# Patient Record
Sex: Male | Born: 1940 | Race: White | Hispanic: No | Marital: Married | Smoking: Former smoker
Health system: Southern US, Community
[De-identification: ages and names within clinical notes are randomized; demographics above are authoritative.]

## PROBLEM LIST (undated history)

## (undated) DIAGNOSIS — H9190 Unspecified hearing loss, unspecified ear: Secondary | ICD-10-CM

## (undated) DIAGNOSIS — M1990 Unspecified osteoarthritis, unspecified site: Secondary | ICD-10-CM

## (undated) DIAGNOSIS — E039 Hypothyroidism, unspecified: Secondary | ICD-10-CM

## (undated) DIAGNOSIS — E785 Hyperlipidemia, unspecified: Secondary | ICD-10-CM

## (undated) DIAGNOSIS — L719 Rosacea, unspecified: Secondary | ICD-10-CM

## (undated) DIAGNOSIS — I4510 Unspecified right bundle-branch block: Secondary | ICD-10-CM

## (undated) DIAGNOSIS — E119 Type 2 diabetes mellitus without complications: Secondary | ICD-10-CM

## (undated) HISTORY — PX: MULTIPLE TOOTH EXTRACTIONS: SHX2053

## (undated) HISTORY — PX: ROTATOR CUFF REPAIR: SHX139

## (undated) HISTORY — DX: Hypothyroidism, unspecified: E03.9

## (undated) HISTORY — DX: Unspecified hearing loss, unspecified ear: H91.90

## (undated) HISTORY — PX: TONSILLECTOMY AND ADENOIDECTOMY: SHX28

## (undated) HISTORY — DX: Hyperlipidemia, unspecified: E78.5

## (undated) HISTORY — DX: Rosacea, unspecified: L71.9

---

## 2000-10-28 HISTORY — PX: OTHER SURGICAL HISTORY: SHX169

## 2001-05-05 ENCOUNTER — Encounter: Payer: Self-pay | Admitting: Neurosurgery

## 2001-05-06 ENCOUNTER — Inpatient Hospital Stay (HOSPITAL_COMMUNITY): Admission: RE | Admit: 2001-05-06 | Discharge: 2001-05-08 | Payer: Self-pay | Admitting: Neurosurgery

## 2003-10-29 HISTORY — PX: COLONOSCOPY: SHX174

## 2008-10-18 ENCOUNTER — Encounter: Admission: RE | Admit: 2008-10-18 | Discharge: 2008-10-18 | Payer: Self-pay | Admitting: Family Medicine

## 2008-11-09 ENCOUNTER — Ambulatory Visit (HOSPITAL_COMMUNITY): Admission: RE | Admit: 2008-11-09 | Discharge: 2008-11-10 | Payer: Self-pay | Admitting: Specialist

## 2008-12-01 ENCOUNTER — Encounter: Admission: RE | Admit: 2008-12-01 | Discharge: 2009-03-01 | Payer: Self-pay | Admitting: Specialist

## 2009-04-20 IMAGING — CR DG CHEST 2V
2 series · 2 of 2 positions shown · non-contrast
Comparison: None

CLINICAL DATA: Preoperative evaluation for  shoulder surgery.

CHEST - 2 VIEW

[w chest pa]
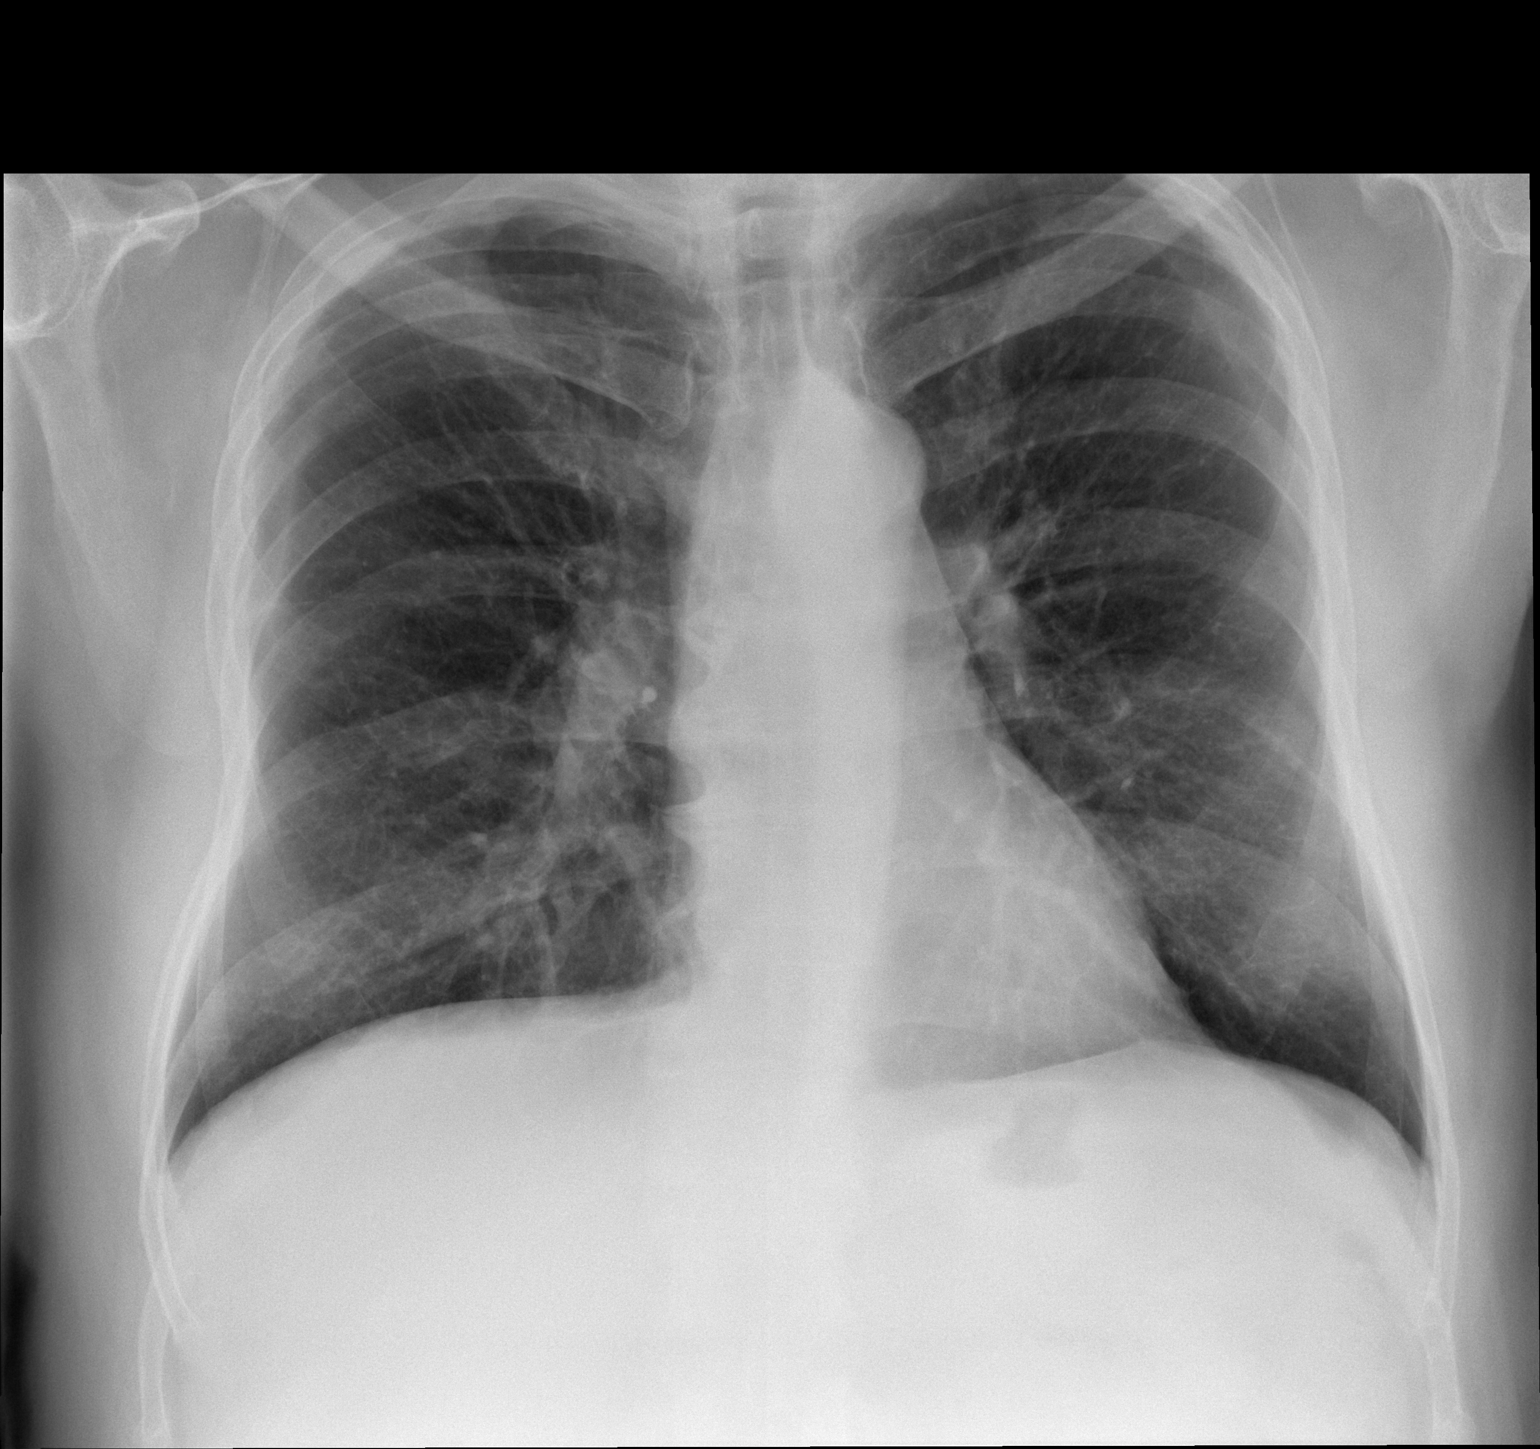

[w chest lat]
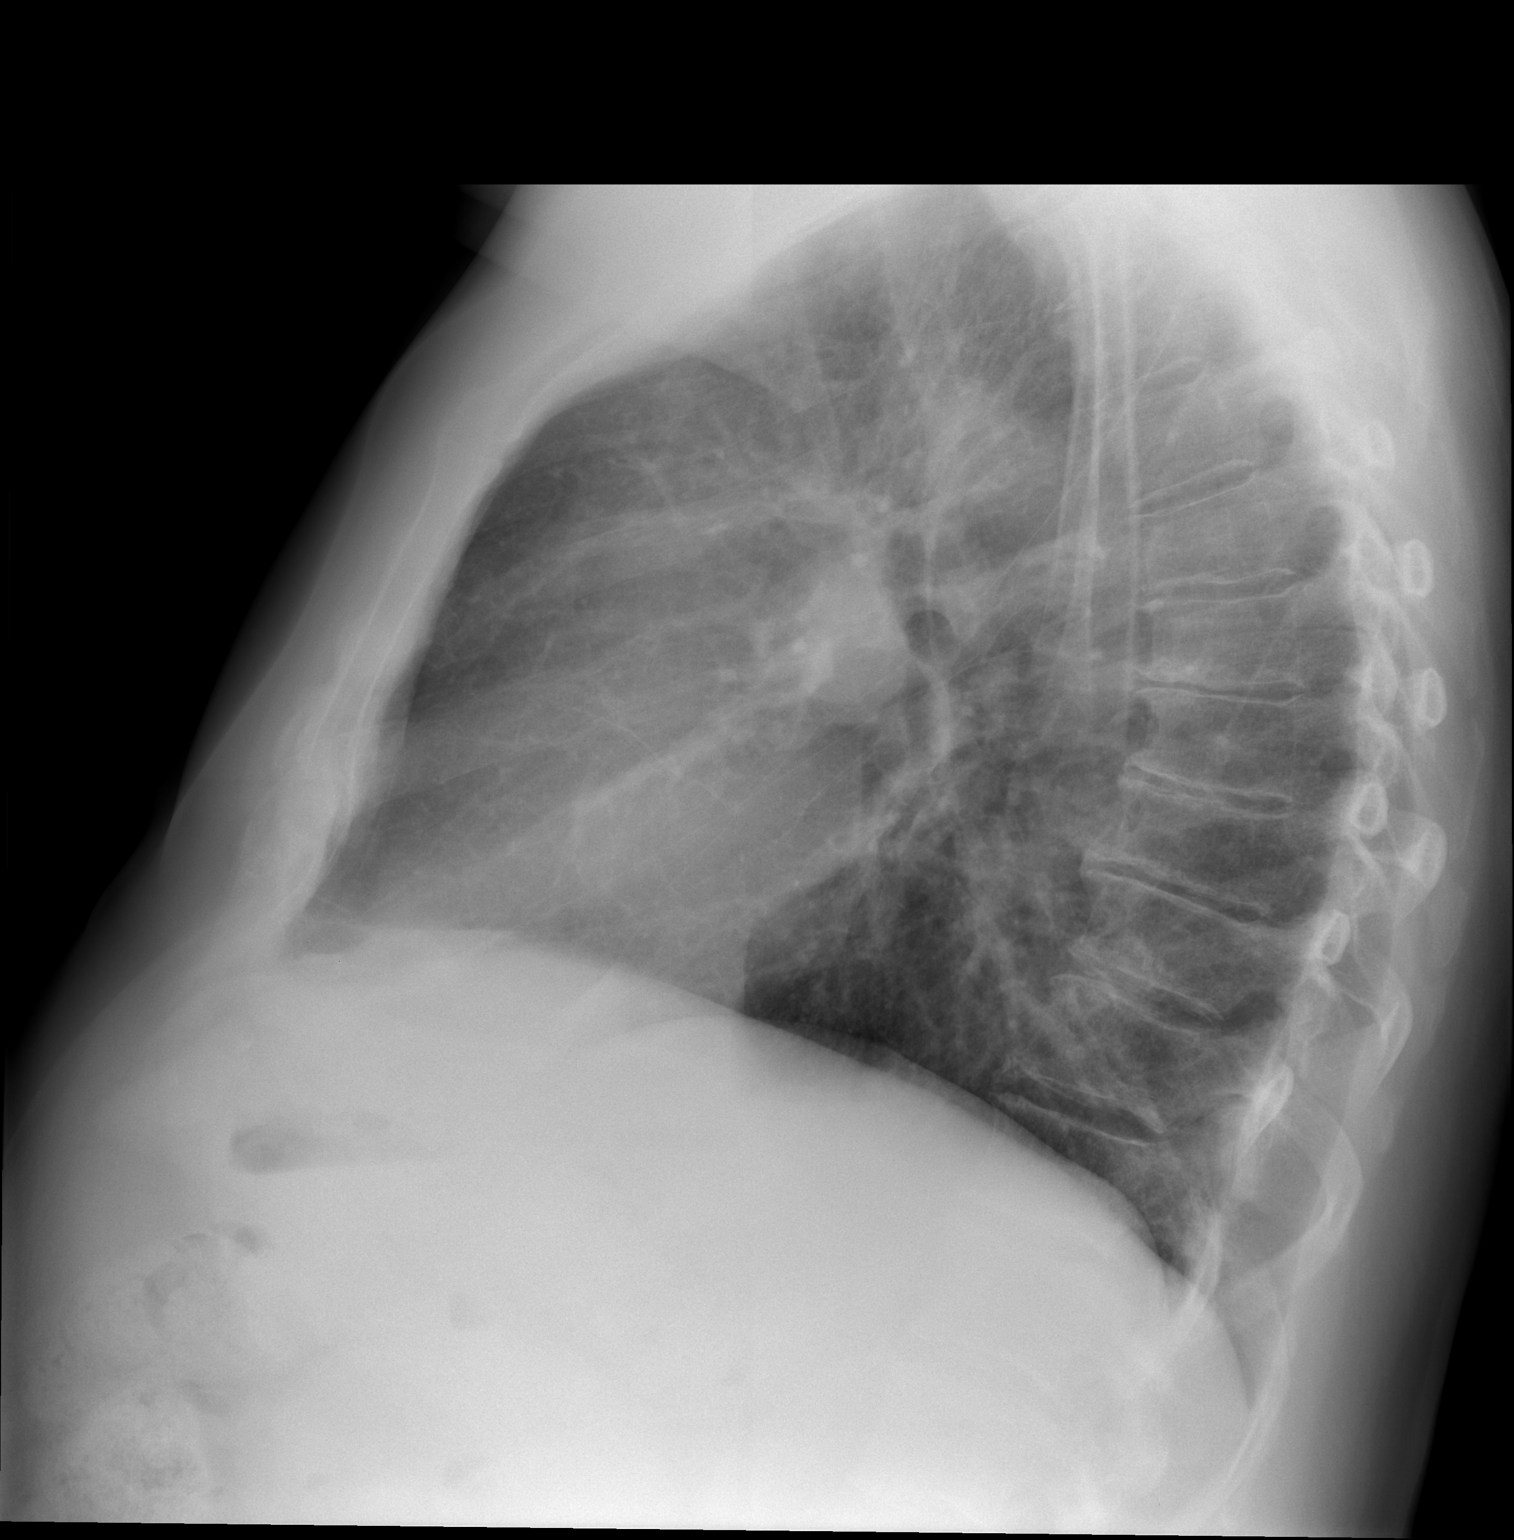

[2 of 2 positions shown; findings below may reference images not displayed]

FINDINGS: Diffuse pulmonary interstitial densities with biapical
pleural parenchymal thickening are chronic.  Negative for
infiltrates, edema or effusions.  Heart and mediastinal contours
are normal.  Diffuse thoracic spine degenerative changes.
IMPRESSION: No acute cardiopulmonary process.

## 2011-02-11 LAB — CBC
HCT: 42.2 % (ref 39.0–52.0)
Hemoglobin: 14 g/dL (ref 13.0–17.0)
MCHC: 33.2 g/dL (ref 30.0–36.0)
MCV: 82 fL (ref 78.0–100.0)
Platelets: 195 10*3/uL (ref 150–400)
RBC: 5.15 MIL/uL (ref 4.22–5.81)
RDW: 14 % (ref 11.5–15.5)
WBC: 4.1 10*3/uL (ref 4.0–10.5)

## 2011-02-11 LAB — BASIC METABOLIC PANEL
BUN: 14 mg/dL (ref 6–23)
CO2: 27 mEq/L (ref 19–32)
Calcium: 9.6 mg/dL (ref 8.4–10.5)
Chloride: 106 mEq/L (ref 96–112)
Creatinine, Ser: 0.71 mg/dL (ref 0.4–1.5)
GFR calc Af Amer: >60 mL/min (ref >60)
GFR calc non Af Amer: >60 mL/min (ref >60)
Glucose, Bld: 63 mg/dL — ABNORMAL LOW (ref 70–99)
Potassium: 4.1 mEq/L (ref 3.5–5.1)
Sodium: 140 mEq/L (ref 135–145)

## 2011-03-12 NOTE — Op Note (Signed)
Jeffrey [name], Jeffrey [name]                  ACCOUNT NO.:  [id number]   MEDICAL RECORD NO.:  [id number]          PATIENT TYPE:  AMB   LOCATION:  DAY                          FACILITY:  [facility]   PHYSICIAN:  Jene Every, M.D.    DATE OF BIRTH:  1941-05-23   DATE OF PROCEDURE:  DATE OF DISCHARGE:                               OPERATIVE REPORT   PREOPERATIVE DIAGNOSES:  1. Rotator cuff tear.  2. Acromioclavicular arthrosis right shoulder.   POSTOPERATIVE DIAGNOSIS:  Massive rotator cuff tear.   PROCEDURES PERFORMED:  1. Open subacromial decompression with acromioplasty.  2. Repair of massive rotator cuff tear with push lock suture anchoring      technique.  3. Open distal clavicle resection.   ASSISTANT:  Roma Schanz, P.A.   BRIEF HISTORY/INDICATION:  This gentleman has shoulder pain, MRI  indicating tear, and AC arthrosis, indicated for decompression rotator  cuff repair, possible distal clavicle resection if noted to be  significant impingement.  Risks, benefits discussed of bleeding,  infection, suboptimal range of motion, retear, persistent pain, etc.   TECHNIQUE:  The patient in supine beach-chair position.  After induction  of adequate general anesthesia 1-2 grams Kefzol of the right shoulder  and upper extremity was prepped and draped in the usual sterile fashion.  Good range of motion was noted under anesthesia.  Incision was made over  the anterolateral aspect of the acromion.  Subcutaneous tissue was  dissected.  Cautery to achieve hemostasis.  A raphe between the  anterolateral heads was identified and divided by 2 cm over the  anterolateral aspect of the acromion, subperiosteally elevated with  retaining the attachment of the deltoid from the anterior aspect of the  acromion.  I digitally lysed adhesions in the subacromial space and  palpated medially with significant impingement from the clavicle noted.  After excising the bursa, noting a large retracted rotator cuff,  also  tearing of the subscap. elected to proceed with a distal clavicle.  First, we extended incision over the distal clavicle incising the  deltotrapezial fascia and the capsule overlying the distal clavicle.  Then, sub skeletonized the distal clavicle with an elevator and with the  rotator cuff protected and the soft tissues protected anteriorly and  posteriorly removed a centimeter of the distal clavicle.  We then  undercut the clavicle with a 3 mm Kerrison removing spurs that were  inferiorly projecting as well as that on the acromion.  This was then  copiously irrigated.  The distal clavicle we placed bone wax on that and  repaired the capsule with O Vicryl interrupted figure-of-eight sutures.  Next, we turned back to the rotator cuff.  This was mobilized, digitally  lysed the ends, debrided the good bleeding tissue.  We felt that the  repair was too advanced from a posterolateral to anterolateral and then  repaired the subscap following that.  We debrided lateral to the  articular margin to good leading bone preparing the bed.  We used an awl  and placed two striker suture anchors in the bone, tested it with  securing of the  anchor.  We then advanced the tendon held into place  with the arm slightly abducted and threaded the four sutures through the  tendon from each suture anchor.  A surgeon's knot was then tied over the  posterior ones first and then crossed and then secured in a double row  fashion over the lateral aspect of the greater tuberosity with push lock  anchors two of them.  This was performed with the first one posteriorly.  After using an awl threading the suture and advancing and securing it  with excellent securing of the posterior portion of the supraspinatus  and infraspinatus.  We then for the one anteriorly threaded the subscap  and the supraspinatus and that threaded it and placed one of these  suture leaflets through the push lock and the anterior aspect just   lateral to the greater tuberosity with a surgeon's knot and securing the  tendon in a double fashion and finally the push lock was then placed in  the middle capturing suture ends from both the anterior and the  posterior suture anchors.  Again, advanced and placed into the push lock  with excellent securing of the tendon.  Then oversewed the subscap and  the rotator cuff interval with #1 Vicryl interrupted figure-of-eight  sutures.  Good watertight closure was noted.  Good range of motion  without tension on the site.  Wound copiously irrigated.  Redundant  suture removed.  After excising the bursa initially we repaired the  raphe with #1 Vicryl interrupted figure-of-eight sutures over the top  and through the acromion, subcu with 2-0 Vicryl simple suture after  copious irrigation of the subacromial space and then of the tissue  layers.  The deltotrapezial fascia was repaired with 2-0 Vicryl simple  sutures and skin was reapproximated with staples.  Would was dressed  sterilely.  Placed in abduction pillow and sling, extubated without  difficulty and transported to the recovery room in satisfactory  condition.   The patient tolerated the procedure well.  There were no complications.      Jene Every, M.D.  Electronically Signed     JB/MEDQ  D:  11/09/2008  T:  11/09/2008  Job:  [id number]

## 2011-03-15 NOTE — Op Note (Signed)
. [facility]  Patient:    EIN, Jeffrey [name]                         MRN: [id number] Proc. Date: 05/06/01 Adm. Date:  [id number] Attending:  Donn Pierini                           Operative Report  PREOPERATIVE DIAGNOSIS:  Right V2 trigeminal neuralgia.  POSTOPERATIVE DIAGNOSIS:  Right V2 trigeminal neuralgia.  PROCEDURE:  Right suboccipital craniectomy with right fifth nerve microvascular decompression.  SURGEON:  Julio Sicks, M.D.  ASSISTANT:  Donalee Citrin, Montez Hageman., M.D.  ANESTHESIA:  General endotracheal.  INDICATIONS:  [name] is a 70 year old male with history of intractable right-sided V2 distribution trigeminal neuralgia.  The patient has been counseled as to his options.  He wishes to proceed with a right-sided microvascular decompression in hopes of alleviating some of his symptoms.  DESCRIPTION OF PROCEDURE:  Patient taken to the operating room and placed on the operating table in supine position.  After an adequate level of anesthesia achieved, the patient was positioned in the left lateral decubitus position with his head fixed in a Sugita pin head rest.  The patients suboccipital region was shaved and prepped sterilely.  A 10 blade was used to make a linear skin incision extending from a point overlying the transverse sinus down to the midpoint between the mastoid process and the suboccipital bone.  This was carried down sharply to the cranium.  A suboccipital craniectomy was then fashioned using the high-speed drill and Kerrison rongeurs.  The craniectomy extended to the transverse sinus superiorly and to the sigmoid sinus laterally.  Dura was then opened in a cruciate fashion.  It was held in place with traction sutures.  CSF was allowed to drain from the cerebellar pontine angle.  The microscope was brought into the field and used for microdissection of the CP angle.  Arachnoid was dissected free from the lateral aspect of  the cerebellum.  A petrosal vein was identified and coagulated using bipolar electrocautery.  This was then divided.  The fifth nerve complex was identified, as was the brainstem.  A Sugita self-retaining retractor was used to hold the cerebellum in place.  There was a superior/ventral vascular loop, which was indenting the fifth nerve complex at the dorsal root entry zone. This was dissected free using microdissection.  The vascular loop was mobilized rostrally along the brainstem.  It was held in place with Teflon felt.  This completely decompressed the fifth nerve complex.  The fifth nerve was further inspected.  There was no evidence of any other vascular loops or other compressive lesions.  The wound was irrigated with antibiotic solution. The dura was loosely reapproximated.  Gelfoam and Tisseel were placed over the dural opening.  Edges of the craniectomy defect were waxed, with particular attention paid to the mastoid air cells.  _____ bone graft substitute was then placed over the craniectomy defect.  The wound was then irrigated one final time.  It was then closed in layers with Vicryl sutures.  A running vertical mattress interlocking suture was then placed over the skin.  A sterile dressing was applied.  There were no apparent complications.  The patient tolerated the procedure well, and he returns to the recovery room postoperatively. DD:  05/06/01 TD:  05/07/01 Job: [id number]

## 2013-10-19 ENCOUNTER — Encounter: Payer: Self-pay | Admitting: Gastroenterology

## 2014-03-29 ENCOUNTER — Encounter: Payer: Self-pay | Admitting: Gastroenterology

## 2014-06-11 ENCOUNTER — Encounter: Payer: Self-pay | Admitting: *Deleted

## 2014-06-17 ENCOUNTER — Encounter: Payer: Self-pay | Admitting: Internal Medicine

## 2014-08-02 ENCOUNTER — Ambulatory Visit (AMBULATORY_SURGERY_CENTER): Payer: Commercial Managed Care - HMO

## 2014-08-02 VITALS — Ht 70.5 in | Wt 235.2 lb

## 2014-08-02 DIAGNOSIS — Z86010 Personal history of colonic polyps: Secondary | ICD-10-CM

## 2014-08-02 MED ORDER — MOVIPREP 100 G PO SOLR: ORAL | Status: DC

## 2014-08-02 NOTE — Progress Notes (Signed)
Per pt, no allergies to soy or egg products.Pt not taking any weight loss meds or using  O2 at home. 

## 2014-08-17 ENCOUNTER — Encounter: Payer: Self-pay | Admitting: Internal Medicine

## 2014-08-17 ENCOUNTER — Ambulatory Visit (AMBULATORY_SURGERY_CENTER): Payer: Commercial Managed Care - HMO | Admitting: Internal Medicine

## 2014-08-17 VITALS — BP 123/72 | HR 74 | Temp 96.6°F | Resp 19 | Ht 70.0 in | Wt 235.0 lb

## 2014-08-17 DIAGNOSIS — D125 Benign neoplasm of sigmoid colon: Secondary | ICD-10-CM

## 2014-08-17 DIAGNOSIS — K635 Polyp of colon: Secondary | ICD-10-CM

## 2014-08-17 DIAGNOSIS — Z1211 Encounter for screening for malignant neoplasm of colon: Secondary | ICD-10-CM

## 2014-08-17 DIAGNOSIS — Z1212 Encounter for screening for malignant neoplasm of rectum: Principal | ICD-10-CM

## 2014-08-17 DIAGNOSIS — D122 Benign neoplasm of ascending colon: Secondary | ICD-10-CM

## 2014-08-17 MED ORDER — SODIUM CHLORIDE 0.9 % IV SOLN: 500.0000 mL | INTRAVENOUS | Status: DC

## 2014-08-17 NOTE — Patient Instructions (Signed)
YOU HAD AN ENDOSCOPIC PROCEDURE TODAY AT THE [facility]: Refer to the procedure report that was given to you for any specific questions about what was found during the examination.  If the procedure report does not answer your questions, please call your gastroenterologist to clarify.  If you requested that your care partner not be given the details of your procedure findings, then the procedure report has been included in a sealed envelope for you to review at your convenience later.  YOU SHOULD EXPECT: Some feelings of bloating in the abdomen. Passage of more gas than usual.  Walking can help get rid of the air that was put into your GI tract during the procedure and reduce the bloating. If you had a lower endoscopy (such as a colonoscopy or flexible sigmoidoscopy) you may notice spotting of blood in your stool or on the toilet paper. If you underwent a bowel prep for your procedure, then you may not have a normal bowel movement for a few days.  DIET: Your first meal following the procedure should be a light meal and then it is ok to progress to your normal diet.  A half-sandwich or bowl of soup is an example of a good first meal.  Heavy or fried foods are harder to digest and may make you feel nauseous or bloated.  Likewise meals heavy in dairy and vegetables can cause extra gas to form and this can also increase the bloating.  Drink plenty of fluids but you should avoid alcoholic beverages for 24 hours.  ACTIVITY: Your care partner should take you home directly after the procedure.  You should plan to take it easy, moving slowly for the rest of the day.  You can resume normal activity the day after the procedure however you should NOT DRIVE or use heavy machinery for 24 hours (because of the sedation medicines used during the test).    SYMPTOMS TO REPORT IMMEDIATELY: A gastroenterologist can be reached at any hour.  During normal business hours, 8:30 AM to 5:00 PM Monday through Friday,  call [phone].  After hours and on weekends, please call the GI answering service at [phone] who will take a message and have the physician on call contact you.   Following lower endoscopy (colonoscopy or flexible sigmoidoscopy):  Excessive amounts of blood in the stool  Significant tenderness or worsening of abdominal pains  Swelling of the abdomen that is new, acute  Fever of 100F or higher    FOLLOW UP: If any biopsies were taken you will be contacted by phone or by letter within the next 1-3 weeks.  Call your gastroenterologist if you have not heard about the biopsies in 3 weeks.  Our staff will call the home number listed on your records the next business day following your procedure to check on you and address any questions or concerns that you may have at that time regarding the information given to you following your procedure. This is a courtesy call and so if there is no answer at the home number and we have not heard from you through the emergency physician on call, we will assume that you have returned to your regular daily activities without incident.  SIGNATURES/CONFIDENTIALITY: You and/or your care partner have signed paperwork which will be entered into your electronic medical record.  These signatures attest to the fact that that the information above on your After Visit Summary has been reviewed and is understood.  Full responsibility of the confidentiality   of this discharge information lies with you and/or your care-partner.  Polyps, diverticulosis, and high fiber diet information given.

## 2014-08-17 NOTE — Op Note (Signed)
[place]  [address] [place], [id number]   COLONOSCOPY PROCEDURE REPORT  PATIENT: Jeffrey, [name]  MR#: [id number] BIRTHDATE: [birth year] , 58  yrs. old GENDER: male ENDOSCOPIST: Jerene Bears, MD REFERRED PO:EUMPNTIRW Shaw, M.D. PROCEDURE DATE:  08/17/2014 PROCEDURE:   Colonoscopy with snare polypectomy and Colonoscopy with biopsy First Screening Colonoscopy - Avg.  risk and is 50 yrs.  old or older - No.  Prior Negative Screening - Now for repeat screening. 10 or more years since last screening  History of Adenoma - Now for follow-up colonoscopy & has been > or = to 3 yrs.  N/A  Polyps Removed Today? Yes. ASA CLASS:   Class II INDICATIONS:average risk for colorectal cancer and last colonoscopy completed 2005 MEDICATIONS: Monitored anesthesia care and Propofol 200 mg IV; lidocaine 40 mg IV  DESCRIPTION OF PROCEDURE:   After the risks benefits and alternatives of the procedure were thoroughly explained, informed consent was obtained.  The digital rectal exam revealed external hemorrhoids and no rectal mass.   The LB ER-XV400 [id number] endoscope was introduced through the anus and advanced to the cecum, which was identified by both the appendix and ileocecal valve. No adverse events experienced.   The quality of the prep was good, using MoviPrep  The instrument was then slowly withdrawn as the colon was fully examined.      COLON FINDINGS: Four sessile polyps ranging from 3 to 83mm in size were found in the ascending colon (3) and sigmoid colon (1). Polypectomies were performed with a cold snare (3 ascending and 1 sigmoid) and with cold forceps (1 ascending).  The resection was complete, the polyp tissue was completely retrieved and sent to histology.   There was mild diverticulosis noted in the transverse colon becoming severe in the descending colon, and sigmoid colon with associated muscular hypertrophy.  Retroflexed views revealed external hemorrhoids.  The time to cecum=3 minutes 18 seconds. Withdrawal time=13 minutes 44 seconds.  The scope was withdrawn and the procedure completed.  COMPLICATIONS: There were no immediate complications.  ENDOSCOPIC IMPRESSION: 1.   Four sessile polyps ranging from 3 to 40mm in size were found in the ascending colon and sigmoid colon; polypectomies were performed with a cold snare and with cold forceps 2.   There was mild diverticulosis noted in the transverse colon becoming severe in the descending colon, and sigmoid colon  RECOMMENDATIONS: 1.  Await pathology results 2.  High fiber diet 3.  Timing of repeat colonoscopy will be determined by pathology findings. 4.  You will receive a letter within 1-2 weeks with the results of your biopsy as well as final recommendations.  Please call my office if you have not received a letter after 3 weeks.  eSigned:  Jerene Bears, MD 08/17/2014 10:45 AM   cc: Mayra Neer, MD and The Patient   PATIENT NAME:  Jeffrey, [name] MR#: [id number]

## 2014-08-17 NOTE — Progress Notes (Signed)
Stable to RR 

## 2014-08-17 NOTE — Progress Notes (Signed)
Called to room to assist during endoscopic procedure.  Patient ID and intended procedure confirmed with present staff. Received instructions for my participation in the procedure from the performing physician.  

## 2014-08-18 ENCOUNTER — Telehealth: Payer: Self-pay

## 2014-08-18 NOTE — Telephone Encounter (Signed)
  Follow up Call-  Call back number 08/17/2014  Post procedure Call Back phone  # [phone]  Permission to leave phone message Yes     Patient questions:  Do you have a fever, pain , or abdominal swelling? No. Pain Score  0 *  Have you tolerated food without any problems? Yes.    Have you been able to return to your normal activities? Yes.    Do you have any questions about your discharge instructions: Diet   No. Medications  No. Follow up visit  No.  Do you have questions or concerns about your Care? No.  Actions: * If pain score is 4 or above: No action needed, pain <4.  No problems per the pt. maw

## 2014-08-23 ENCOUNTER — Encounter: Payer: Self-pay | Admitting: Internal Medicine

## 2019-05-19 ENCOUNTER — Encounter: Payer: Self-pay | Admitting: *Deleted

## 2019-08-03 ENCOUNTER — Encounter: Payer: Self-pay | Admitting: Internal Medicine

## 2019-08-10 ENCOUNTER — Encounter: Payer: Self-pay | Admitting: Internal Medicine

## 2019-09-06 ENCOUNTER — Other Ambulatory Visit: Payer: Self-pay

## 2019-09-06 ENCOUNTER — Ambulatory Visit (AMBULATORY_SURGERY_CENTER): Payer: Medicare HMO | Admitting: *Deleted

## 2019-09-06 ENCOUNTER — Encounter: Payer: Self-pay | Admitting: Internal Medicine

## 2019-09-06 VITALS — Ht 69.0 in | Wt 225.8 lb

## 2019-09-06 DIAGNOSIS — Z86010 Personal history of colonic polyps: Secondary | ICD-10-CM

## 2019-09-06 DIAGNOSIS — Z1159 Encounter for screening for other viral diseases: Secondary | ICD-10-CM

## 2019-09-06 MED ORDER — SUPREP BOWEL PREP KIT 17.5-3.13-1.6 GM/177ML PO SOLN: 1.0000 | Freq: Once | ORAL | 0 refills | Status: AC

## 2019-09-06 NOTE — Progress Notes (Signed)
Patient denies any allergies to egg or soy products. Patient denies complications with anesthesia/sedation.  Patient denies oxygen use at home and denies diet medications. Emmi instructions for colonoscopy/endoscopy explained and given to patient.  Patient instructed to take allowed medications by 5:30 am on 09/20/19.

## 2019-09-15 ENCOUNTER — Ambulatory Visit (INDEPENDENT_AMBULATORY_CARE_PROVIDER_SITE_OTHER): Payer: Medicare HMO

## 2019-09-15 ENCOUNTER — Other Ambulatory Visit: Payer: Self-pay | Admitting: Internal Medicine

## 2019-09-15 DIAGNOSIS — Z1159 Encounter for screening for other viral diseases: Secondary | ICD-10-CM

## 2019-09-16 LAB — SARS CORONAVIRUS 2 (TAT 6-24 HRS): SARS Coronavirus 2: NEGATIVE

## 2019-09-20 ENCOUNTER — Ambulatory Visit (AMBULATORY_SURGERY_CENTER): Payer: Medicare HMO | Admitting: Internal Medicine

## 2019-09-20 ENCOUNTER — Encounter: Payer: Self-pay | Admitting: Internal Medicine

## 2019-09-20 ENCOUNTER — Other Ambulatory Visit: Payer: Self-pay

## 2019-09-20 VITALS — BP 116/73 | HR 72 | Temp 98.5°F | Resp 16 | Ht 69.0 in | Wt 225.0 lb

## 2019-09-20 DIAGNOSIS — D124 Benign neoplasm of descending colon: Secondary | ICD-10-CM

## 2019-09-20 DIAGNOSIS — Z86010 Personal history of colonic polyps: Secondary | ICD-10-CM | POA: Diagnosis not present

## 2019-09-20 DIAGNOSIS — K635 Polyp of colon: Secondary | ICD-10-CM

## 2019-09-20 MED ORDER — SODIUM CHLORIDE 0.9 % IV SOLN: 500.0000 mL | Freq: Once | INTRAVENOUS | Status: DC

## 2019-09-20 NOTE — Op Note (Signed)
[facility] Patient Name: Jeffrey [name] Procedure Date: 09/20/2019 8:46 AM MRN: [id number] Endoscopist: Jerene Bears , MD Age: 78 Referring MD:  Date of Birth: [birth year] Gender: Male Account #: [id number] Procedure:                Colonoscopy Indications:              Surveillance: Personal history of adenomatous                            polyps on last colonoscopy 5 years ago Medicines:                Monitored Anesthesia Care Procedure:                Pre-Anesthesia Assessment:                           - Prior to the procedure, a History and Physical                            was performed, and patient medications and                            allergies were reviewed. The patient's tolerance of                            previous anesthesia was also reviewed. The risks                            and benefits of the procedure and the sedation                            options and risks were discussed with the patient.                            All questions were answered, and informed consent                            was obtained. Prior Anticoagulants: The patient has                            taken no previous anticoagulant or antiplatelet                            agents. ASA Grade Assessment: II - A patient with                            mild systemic disease. After reviewing the risks                            and benefits, the patient was deemed in                            satisfactory condition to undergo the procedure.  After obtaining informed consent, the colonoscope                            was passed under direct vision. Throughout the                            procedure, the patient's blood pressure, pulse, and                            oxygen saturations were monitored continuously. The                            Colonoscope was introduced through the anus and                            advanced to the cecum,  identified by appendiceal                            orifice and ileocecal valve. The colonoscopy was                            performed without difficulty. The patient tolerated                            the procedure well. The quality of the bowel                            preparation was good. The ileocecal valve,                            appendiceal orifice, and rectum were photographed. Scope In: 8:48:08 AM Scope Out: 9:03:16 AM Scope Withdrawal Time: 0 hours 12 minutes 3 seconds  Total Procedure Duration: 0 hours 15 minutes 8 seconds  Findings:                 The digital rectal exam was normal.                           Two sessile polyps were found in the descending                            colon. The polyps were 3 to 4 mm in size. These                            polyps were removed with a cold snare. Resection                            and retrieval were complete.                           Multiple small and large-mouthed diverticula were                            found in the sigmoid colon, descending colon and  ascending colon. Moderate to severe left sided                            diverticulosis.                           Internal hemorrhoids were found during                            retroflexion. The hemorrhoids were small. Complications:            No immediate complications. Estimated Blood Loss:     Estimated blood loss was minimal. Impression:               - Two 3 to 4 mm polyps in the descending colon,                            removed with a cold snare. Resected and retrieved.                           - Diverticulosis in the sigmoid colon, in the                            descending colon and in the ascending colon.                           - Small internal hemorrhoids. Recommendation:           - Patient has a contact number available for                            emergencies. The signs and symptoms of potential                             delayed complications were discussed with the                            patient. Return to normal activities tomorrow.                            Written discharge instructions were provided to the                            patient.                           - Resume previous diet.                           - Continue present medications.                           - Await pathology results.                           - You will likely not need a repeat colonoscopy for  surveillance due to age as this test generally                            stops around age 51. If you develop concerning                            symptoms such as blood in stools, abdominal pain or                            change in bowel habits please contact my office. Jerene Bears, MD 09/20/2019 9:07:12 AM This report has been signed electronically.

## 2019-09-20 NOTE — Progress Notes (Signed)
Called to room to assist during endoscopic procedure.  Patient ID and intended procedure confirmed with present staff. Received instructions for my participation in the procedure from the performing physician.  

## 2019-09-20 NOTE — Patient Instructions (Signed)
Thank you for for allowing [place] to care for you today!  Resume previous diet and medications today.  Return to your normal activities tomorrow.  Await pathology results by mail, approximately 7-10 days.  Will likely not need a repeat colonoscopy due to current age guidelines.   Don't hesitate to call if you develop and changes in bowel habits or other unusual symptoms.      Thank YOU HAD AN ENDOSCOPIC PROCEDURE TODAY AT [facility]:   Refer to the procedure report that was given to you for any specific questions about what was found during the examination.  If the procedure report does not answer your questions, please call your gastroenterologist to clarify.  If you requested that your care partner not be given the details of your procedure findings, then the procedure report has been included in a sealed envelope for you to review at your convenience later.  YOU SHOULD EXPECT: Some feelings of bloating in the abdomen. Passage of more gas than usual.  Walking can help get rid of the air that was put into your GI tract during the procedure and reduce the bloating. If you had a lower endoscopy (such as a colonoscopy or flexible sigmoidoscopy) you may notice spotting of blood in your stool or on the toilet paper. If you underwent a bowel prep for your procedure, you may not have a normal bowel movement for a few days.  Please Note:  You might notice some irritation and congestion in your nose or some drainage.  This is from the oxygen used during your procedure.  There is no need for concern and it should clear up in a day or so.  SYMPTOMS TO REPORT IMMEDIATELY:   Following lower endoscopy (colonoscopy or flexible sigmoidoscopy):  Excessive amounts of blood in the stool  Significant tenderness or worsening of abdominal pains  Swelling of the abdomen that is new, acute  Fever of 100F or higher    For urgent or emergent issues, a gastroenterologist can be reached at any hour by  calling [phone].   DIET:  We do recommend a small meal at first, but then you may proceed to your regular diet.  Drink plenty of fluids but you should avoid alcoholic beverages for 24 hours.  ACTIVITY:  You should plan to take it easy for the rest of today and you should NOT DRIVE or use heavy machinery until tomorrow (because of the sedation medicines used during the test).    FOLLOW UP: Our staff will call the number listed on your records 48-72 hours following your procedure to check on you and address any questions or concerns that you may have regarding the information given to you following your procedure. If we do not reach you, we will leave a message.  We will attempt to reach you two times.  During this call, we will ask if you have developed any symptoms of COVID 19. If you develop any symptoms (ie: fever, flu-like symptoms, shortness of breath, cough etc.) before then, please call [phone].  If you test positive for Covid 19 in the 2 weeks post procedure, please call and report this information to [place].    If any biopsies were taken you will be contacted by phone or by letter within the next 1-3 weeks.  Please call us at [phone] if you have not heard about the biopsies in 3 weeks.    SIGNATURES/CONFIDENTIALITY: You and/or your care partner have signed paperwork which will be  entered into your electronic medical record.  These signatures attest to the fact that that the information above on your After Visit Summary has been reviewed and is understood.  Full responsibility of the confidentiality of this discharge information lies with you and/or your care-partner. 

## 2019-09-20 NOTE — Progress Notes (Signed)
Temp check by:YF Vital check by:CW  The patient states no changes in medical or surgical history since pre-visit screening on 09/06/2019.

## 2019-09-20 NOTE — Progress Notes (Signed)
Report given to PACU, vss 

## 2019-09-22 ENCOUNTER — Telehealth: Payer: Self-pay | Admitting: *Deleted

## 2019-09-22 NOTE — Telephone Encounter (Signed)
  Follow up Call-  Call back number 09/20/2019  Post procedure Call Back phone  # [phone]  Permission to leave phone message Yes  Some recent data might be hidden     Patient questions:  Do you have a fever, pain , or abdominal swelling? No. Pain Score  0 *  Have you tolerated food without any problems? Yes.    Have you been able to return to your normal activities? Yes.    Do you have any questions about your discharge instructions: Diet   No. Medications  No. Follow up visit  No.  Do you have questions or concerns about your Care? No.  Actions: * If pain score is 4 or above: No action needed, pain <4.  1. Have you developed a fever since your procedure? no  2.   Have you had an respiratory symptoms (SOB or cough) since your procedure? no  3.   Have you tested positive for COVID 19 since your procedure no  4.   Have you had any family members/close contacts diagnosed with the COVID 19 since your procedure?  no   If yes to any of these questions please route to Joylene John, RN and Alphonsa Gin, Therapist, sports.

## 2019-09-27 ENCOUNTER — Encounter: Payer: Self-pay | Admitting: Internal Medicine

## 2024-05-26 ENCOUNTER — Encounter (HOSPITAL_COMMUNITY): Payer: Self-pay

## 2024-05-26 NOTE — Patient Instructions (Addendum)
 SURGICAL WAITING ROOM VISITATION  Patients having surgery or a procedure may have no more than 2 support people in the waiting area - these visitors may rotate.    Children under the age of 67 must have an adult with them who is not the patient.  Visitors with respiratory illnesses are discouraged from visiting and should remain at home.  If the patient needs to stay at the hospital during part of their recovery, the visitor guidelines for inpatient rooms apply. Pre-op nurse will coordinate an appropriate time for 1 support person to accompany patient in pre-op.  This support person may not rotate.    Please refer to the [facility] website for the visitor guidelines for Inpatients (after your surgery is over and you are in a regular room).       Your procedure is scheduled on: 06-08-24   Report to [facility] Main Entrance    Report to admitting at     0610  AM   Call this number if you have problems the morning of surgery [phone]   Do not eat food :After Midnight.   After Midnight you may have the following liquids until __0540____ AM/  DAY OF SURGERY then nothing by mouth  Water Non-Citrus Juices (without pulp, NO RED-Apple, White grape, White cranberry) Black Coffee (NO MILK/CREAM OR CREAMERS, sugar ok)  Clear Tea (NO MILK/CREAM OR CREAMERS, sugar ok) regular and decaf                             Plain Jell-O (NO RED)                                           Fruit ices (not with fruit pulp, NO RED)                                     Popsicles (NO RED)                                                               Sports drinks like Gatorade (NO RED)                    The day of surgery:  Drink ONE (1) Pre-Surgery  G2 BY    0540   AM the morning of surgery. Drink in one sitting. Do not sip.  This drink was given to you during your hospital  pre-op appointment visit. Nothing else to drink after completing the  Pre-Surgery G2.          If you have  questions, please contact your surgeon's office.   FOLLOW ANY ADDITIONAL PRE OP INSTRUCTIONS YOU RECEIVED FROM YOUR SURGEON'S OFFICE!!!     Oral Hygiene is also important to reduce your risk of infection.                                    Remember - BRUSH YOUR TEETH THE MORNING OF SURGERY WITH YOUR  REGULAR TOOTHPASTE  DENTURES WILL BE REMOVED PRIOR TO SURGERY PLEASE DO NOT APPLY Poly grip OR ADHESIVES!!!   Do NOT smoke after Midnight   Stop all vitamins and herbal supplements 7 days before surgery.   Take these medicines the morning of surgery with A SIP OF WATER: oxycodone if needed, levothyroxine, atorvastatin  DO NOT TAKE ANY ORAL DIABETIC MEDICATIONS DAY OF YOUR SURGERY  Bring CPAP mask and tubing day of surgery.                              You may not have any metal on your body including hair pins, jewelry, and body piercing             Do not wear lotions, powders, cologne, or deodorant               Men may shave face and neck.   Do not bring valuables to the hospital. [facility] IS NOT             RESPONSIBLE   FOR VALUABLES.   Contacts, glasses, dentures or bridgework may not be worn into surgery.   Bring small overnight bag day of surgery.   DO NOT BRING YOUR HOME MEDICATIONS TO THE HOSPITAL. PHARMACY WILL DISPENSE MEDICATIONS LISTED ON YOUR MEDICATION LIST TO YOU DURING YOUR ADMISSION IN THE HOSPITAL!    Patients discharged on the day of surgery will not be allowed to drive home.  Someone NEEDS to stay with you for the first 24 hours after anesthesia.   Special Instructions: Bring a copy of your healthcare power of attorney and living will documents the day of surgery if you haven't scanned them before.              Please read over the following fact sheets you were given: IF YOU HAVE QUESTIONS ABOUT YOUR PRE-OP INSTRUCTIONS PLEASE CALL [phone].    If you test positive for Covid or have been in contact with anyone that has tested positive in the last 10  days please notify you surgeon.      Pre-operative 5 CHG Bath Instructions   You can play a key role in reducing the risk of infection after surgery. Your skin needs to be as free of germs as possible. You can reduce the number of germs on your skin by washing with CHG (chlorhexidine gluconate) soap before surgery. CHG is an antiseptic soap that kills germs and continues to kill germs even after washing.   DO NOT use if you have an allergy to chlorhexidine/CHG or antibacterial soaps. If your skin becomes reddened or irritated, stop using the CHG and notify one of our RNs at [phone].   Please shower with the CHG soap starting 4 days before surgery using the following schedule:     Please keep in mind the following:  DO NOT shave, including legs and underarms, starting the day of your first shower.   You may shave your face at any point before/day of surgery.  Place clean sheets on your bed the day you start using CHG soap. Use a clean washcloth (not used since being washed) for each shower. DO NOT sleep with pets once you start using the CHG.   CHG Shower Instructions:  If you choose to wash your hair and private area, wash first with your normal shampoo/soap.  After you use shampoo/soap, rinse your hair and body thoroughly to remove shampoo/soap residue.  Turn the water OFF and apply about 3 tablespoons (45 ml) of CHG soap to a CLEAN washcloth.  Apply CHG soap ONLY FROM YOUR NECK DOWN TO YOUR TOES (washing for 3-5 minutes)  DO NOT use CHG soap on face, private areas, open wounds, or sores.  Pay special attention to the area where your surgery is being performed.  If you are having back surgery, having someone wash your back for you may be helpful. Wait 2 minutes after CHG soap is applied, then you may rinse off the CHG soap.  Pat dry with a clean towel  Put on clean clothes/pajamas   If you choose to wear lotion, please use ONLY the CHG-compatible lotions on the back of this  paper.     Additional instructions for the day of surgery: DO NOT APPLY any lotions, deodorants, cologne, or perfumes.   Put on clean/comfortable clothes.  Brush your teeth.  Ask your nurse before applying any prescription medications to the skin.      CHG Compatible Lotions   Aveeno Moisturizing lotion  Cetaphil Moisturizing Cream  Cetaphil Moisturizing Lotion  Clairol Herbal Essence Moisturizing Lotion, Dry Skin  Clairol Herbal Essence Moisturizing Lotion, Extra Dry Skin  Clairol Herbal Essence Moisturizing Lotion, Normal Skin  Curel Age Defying Therapeutic Moisturizing Lotion with Alpha Hydroxy  Curel Extreme Care Body Lotion  Curel Soothing Hands Moisturizing Hand Lotion  Curel Therapeutic Moisturizing Cream, Fragrance-Free  Curel Therapeutic Moisturizing Lotion, Fragrance-Free  Curel Therapeutic Moisturizing Lotion, Original Formula  Eucerin Daily Replenishing Lotion  Eucerin Dry Skin Therapy Plus Alpha Hydroxy Crme  Eucerin Dry Skin Therapy Plus Alpha Hydroxy Lotion  Eucerin Original Crme  Eucerin Original Lotion  Eucerin Plus Crme Eucerin Plus Lotion  Eucerin TriLipid Replenishing Lotion  Keri Anti-Bacterial Hand Lotion  Keri Deep Conditioning Original Lotion Dry Skin Formula Softly Scented  Keri Deep Conditioning Original Lotion, Fragrance Free Sensitive Skin Formula  Keri Lotion Fast Absorbing Fragrance Free Sensitive Skin Formula  Keri Lotion Fast Absorbing Softly Scented Dry Skin Formula  Keri Original Lotion  Keri Skin Renewal Lotion Keri Silky Smooth Lotion  Keri Silky Smooth Sensitive Skin Lotion  Nivea Body Creamy Conditioning Oil  Nivea Body Extra Enriched Teacher, adult education Moisturizing Lotion Nivea Crme  Nivea Skin Firming Lotion  NutraDerm 30 Skin Lotion  NutraDerm Skin Lotion  NutraDerm Therapeutic Skin Cream  NutraDerm Therapeutic Skin Lotion  ProShield Protective Hand Cream  WHAT IS A BLOOD TRANSFUSION?  Blood Transfusion Information  A transfusion is the replacement of blood or some of its parts. Blood is made up of multiple cells which provide different functions. Red blood cells carry oxygen and are used for blood loss replacement. White blood cells fight against infection. Platelets control bleeding. Plasma helps clot blood. Other blood products are available for specialized needs, such as hemophilia or other clotting disorders. BEFORE THE TRANSFUSION  Who gives blood for transfusions?  Healthy volunteers who are fully evaluated to make sure their blood is safe. This is blood bank blood. Transfusion therapy is the safest it has ever been in the practice of medicine. Before blood is taken from a donor, a complete history is taken to make sure that person has no history of diseases nor engages in risky social behavior (examples are intravenous drug use or sexual activity with multiple partners). The donor's travel history is screened to minimize risk of transmitting infections, such as malaria. The donated blood is tested for signs of infectious  diseases, such as HIV and hepatitis. The blood is then tested to be sure it is compatible with you in order to minimize the chance of a transfusion reaction. If you or a relative donates blood, this is often done in anticipation of surgery and is not appropriate for emergency situations. It takes many days to process the donated blood. RISKS AND COMPLICATIONS Although transfusion therapy is very safe and saves many lives, the main dangers of transfusion include:  Getting an infectious disease. Developing a transfusion reaction. This is an allergic reaction to something in the blood you were given. Every precaution is taken to prevent this. The decision to have a blood transfusion has been considered carefully by your caregiver before blood is given. Blood is not given unless the benefits outweigh the risks. AFTER THE TRANSFUSION Right after receiving a  blood transfusion, you will usually feel much better and more energetic. This is especially true if your red blood cells have gotten low (anemic). The transfusion raises the level of the red blood cells which carry oxygen, and this usually causes an energy increase. The nurse administering the transfusion will monitor you carefully for complications. HOME CARE INSTRUCTIONS  No special instructions are needed after a transfusion. You may find your energy is better. Speak with your caregiver about any limitations on activity for underlying diseases you may have. SEEK MEDICAL CARE IF:  Your condition is not improving after your transfusion. You develop redness or irritation at the intravenous (IV) site. SEEK IMMEDIATE MEDICAL CARE IF:  Any of the following symptoms occur over the next 12 hours: Shaking chills. You have a temperature by mouth above 102 F (38.9 C), not controlled by medicine. Chest, back, or muscle pain. People around you feel you are not acting correctly or are confused. Shortness of breath or difficulty breathing. Dizziness and fainting. You get a rash or develop hives. You have a decrease in urine output. Your urine turns a dark color or changes to pink, red, or brown. Any of the following symptoms occur over the next 10 days: You have a temperature by mouth above 102 F (38.9 C), not controlled by medicine. Shortness of breath. Weakness after normal activity. The white part of the eye turns yellow (jaundice). You have a decrease in the amount of urine or are urinating less often. Your urine turns a dark color or changes to pink, red, or brown. Document Released: 10/11/2000 Document Revised: 01/06/2012 Document Reviewed: 05/30/2008 Exit[employer] Patient Information 2014 [place], [place].  _______________________________________________________________________  Incentive Spirometer  An incentive spirometer is a tool that can help keep your lungs clear and active. This tool  measures how well you are filling your lungs with each breath. Taking long deep breaths may help reverse or decrease the chance of developing breathing (pulmonary) problems (especially infection) following: A long period of time when you are unable to move or be active. BEFORE THE PROCEDURE  If the spirometer includes an indicator to show your best effort, your nurse or respiratory therapist will set it to a desired goal. If possible, sit up straight or lean slightly forward. Try not to slouch. Hold the incentive spirometer in an upright position. INSTRUCTIONS FOR USE  Sit on the edge of your bed if possible, or sit up as far as you can in bed or on a chair. Hold the incentive spirometer in an upright position. Breathe out normally. Place the mouthpiece in your mouth and seal your lips tightly around it. Breathe in slowly and as deeply  as possible, raising the piston or the ball toward the top of the column. Hold your breath for 3-5 seconds or for as long as possible. Allow the piston or ball to fall to the bottom of the column. Remove the mouthpiece from your mouth and breathe out normally. Rest for a few seconds and repeat Steps 1 through 7 at least 10 times every 1-2 hours when you are awake. Take your time and take a few normal breaths between deep breaths. The spirometer may include an indicator to show your best effort. Use the indicator as a goal to work toward during each repetition. After each set of 10 deep breaths, practice coughing to be sure your lungs are clear. If you have an incision (the cut made at the time of surgery), support your incision when coughing by placing a pillow or rolled up towels firmly against it. Once you are able to get out of bed, walk around indoors and cough well. You may stop using the incentive spirometer when instructed by your caregiver.  RISKS AND COMPLICATIONS Take your time so you do not get dizzy or light-headed. If you are in pain, you may need to  take or ask for pain medication before doing incentive spirometry. It is harder to take a deep breath if you are having pain. AFTER USE Rest and breathe slowly and easily. It can be helpful to keep track of a log of your progress. Your caregiver can provide you with a simple table to help with this. If you are using the spirometer at home, follow these instructions: SEEK MEDICAL CARE IF:  You are having difficultly using the spirometer. You have trouble using the spirometer as often as instructed. Your pain medication is not giving enough relief while using the spirometer. You develop fever of 100.5 F (38.1 C) or higher. SEEK IMMEDIATE MEDICAL CARE IF:  You cough up bloody sputum that had not been present before. You develop fever of 102 F (38.9 C) or greater. You develop worsening pain at or near the incision site. MAKE SURE YOU:  Understand these instructions. Will watch your condition. Will get help right away if you are not doing well or get worse. Document Released: 02/24/2007 Document Revised: 01/06/2012 Document Reviewed: 04/27/2007 [facility] Patient Information 2014 [place], [place].   ________________________________________________________________________

## 2024-05-26 NOTE — Progress Notes (Addendum)
 PCP - Tinnie Salt , PA  LOV 05-11-24 epic Cardiologist -Asif Wahid,MD  LOV 05-18-24 epic   Clearance in media    05-21-24  PPM/ICD -  Device Orders -  Rep Notified -   Chest x-ray - 05-12-24 CE EKG - 05-18-24   media Stress Test -  ECHO - 05-18-24 CE Cardiac Cath -  LABS- HgbA1c, CMP, CBC/DIFF   DONE 05-11-24 CE  Sleep Study -  CPAP -   Fasting Blood Sugar -  Checks Blood Sugar __0___ times a day  Blood Thinner Instructions:n/a Aspirin Instructions:n/a  ERAS Protcol - PRE-SURGERY G2-    COVID vaccine -yes  Activity--Able to climb a flight of stairs with no CP or SOB  Plays golf 3 times a week walks and rides golf cart . 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 Patient agrees to go over the instructions while at home for a better understanding. Patient also instructed to self quarantine after being tested for COVID-19. The opportunity to ask questions was provided.

## 2024-05-31 ENCOUNTER — Encounter (HOSPITAL_COMMUNITY): Payer: Self-pay

## 2024-05-31 ENCOUNTER — Other Ambulatory Visit: Payer: Self-pay

## 2024-05-31 ENCOUNTER — Encounter (HOSPITAL_COMMUNITY)
Admission: RE | Admit: 2024-05-31 | Discharge: 2024-05-31 | Disposition: A | Discharge status: Home / Self Care | Source: Ambulatory Visit | Attending: Orthopedic Surgery | Admitting: Orthopedic Surgery

## 2024-05-31 VITALS — BP 153/92 | HR 88 | Temp 98.0°F | Resp 16 | Ht 70.0 in | Wt 217.0 lb

## 2024-05-31 DIAGNOSIS — M1611 Unilateral primary osteoarthritis, right hip: Secondary | ICD-10-CM | POA: Diagnosis not present

## 2024-05-31 DIAGNOSIS — E119 Type 2 diabetes mellitus without complications: Secondary | ICD-10-CM | POA: Diagnosis not present

## 2024-05-31 DIAGNOSIS — Z01812 Encounter for preprocedural laboratory examination: Secondary | ICD-10-CM | POA: Insufficient documentation

## 2024-05-31 DIAGNOSIS — Z01818 Encounter for other preprocedural examination: Secondary | ICD-10-CM

## 2024-05-31 HISTORY — DX: Unspecified right bundle-branch block: I45.10

## 2024-05-31 HISTORY — DX: Unspecified osteoarthritis, unspecified site: M19.90

## 2024-05-31 HISTORY — DX: Type 2 diabetes mellitus without complications: E11.9

## 2024-05-31 LAB — GLUCOSE, CAPILLARY: Glucose-Capillary: 186 mg/dL — ABNORMAL HIGH (ref 70–99)

## 2024-05-31 LAB — SURGICAL PCR SCREEN
MRSA, PCR: NEGATIVE
Staphylococcus aureus: NEGATIVE

## 2024-06-01 NOTE — H&P (Signed)
 TOTAL HIP ADMISSION H&P  Patient is admitted for right total hip arthroplasty.  Therapy Plans: HEP Disposition: Home with wife and daughter helping Planned DVT Prophylaxis: aspirin 81mg  BID DME needed: walker PCP: Tinnie Salt - Cardio: Dr. Erie - TXA: IV Allergies: NKDA Anesthesia Concerns: none BMI: 31 Last HgbA1c: 6.3 - not diabetic   Other: - Golfer and works at [employer] -- golfs 3 days/week at baseline - No hx of VTE or cancer - oxycodone, robaxin, tylenol, celebrex - Wants to go home SD  Subjective:  Chief Complaint: right hip pain  HPI: Jeffrey [name], 83 y.o. male, has a history of pain and functional disability in the right hip(s) due to arthritis and patient has failed non-surgical conservative treatments for greater than 12 weeks to include NSAID's and/or analgesics and activity modification.  Onset of symptoms was gradual starting 2 years ago with gradually worsening course since that time.The patient noted no past surgery on the right hip(s).  Patient currently rates pain in the right hip at 8 out of 10 with activity. Patient has worsening of pain with activity and weight bearing, pain that interfers with activities of daily living, and pain with passive range of motion. Patient has evidence of joint space narrowing by imaging studies. This condition presents safety issues increasing the risk of falls.   There is no current active infection.  There are no active problems to display for this patient.  Past Medical History:  Diagnosis Date   Arthritis    hip   Diabetes mellitus without complication (HCC)    no meds last A1c 6.3  05-11-24    previous 7.0   Hearing loss    no hearing aids   Hyperlipidemia    Hypothyroidism    Right bundle branch block    Rosacea     Past Surgical History:  Procedure Laterality Date   COLONOSCOPY  10/2003   MULTIPLE TOOTH EXTRACTIONS     upper dentures and lower partial   right trigeminal  2002   nerve surg    ROTATOR CUFF REPAIR Right    Dr. Duwayne   TONSILLECTOMY AND ADENOIDECTOMY      No current facility-administered medications for this encounter.   Current Outpatient Medications  Medication Sig Dispense Refill Last Dose/Taking   acetaminophen (TYLENOL) 500 MG tablet Take 500-1,000 mg by mouth 2 (two) times daily as needed (pain.).   Taking As Needed   atorvastatin (LIPITOR) 20 MG tablet Take by mouth daily with lunch.   Taking   Cholecalciferol (VITAMIN D-3 PO) Take 1,000 Units by mouth daily with lunch.   Taking   cyanocobalamin (VITAMIN B12) 1000 MCG tablet Take 1,000 mcg by mouth daily with lunch.   Taking   ketoconazole (NIZORAL) 2 % shampoo Apply 1 Application topically 2 (two) times a week.   Taking   levothyroxine (SYNTHROID, LEVOTHROID) 50 MCG tablet Take 50 mcg by mouth daily before breakfast.   Taking   Methenamine-Sodium Salicylate (AZO URINARY TRACT DEFENSE PO) Take 1 tablet by mouth daily with lunch.   Taking   Multiple Vitamins-Minerals (MULTIVITAMIN PO) Take 1 tablet by mouth daily with lunch.   Taking   oxyCODONE (OXY IR/ROXICODONE) 5 MG immediate release tablet Take 2.5-5 mg by mouth every 4 (four) hours as needed (pain.).   Taking As Needed   No Known Allergies  Social History   Tobacco Use   Smoking status: Former    Current packs/day: 0.00    Types: Cigarettes  Quit date: 08/02/1964    Years since quitting: 59.8   Smokeless tobacco: Never  Substance Use Topics   Alcohol use: Yes    Alcohol/week: 1.0 - 2.0 standard drink of alcohol    Types: 1 - 2 Cans of beer per week    Comment: vacations    Family History  Problem Relation Age of Onset   Stroke Mother        hemorrhagic   Diabetes Mellitus I Father    Dementia Sister    Colon cancer Neg Hx    Rectal cancer Neg Hx    Stomach cancer Neg Hx    Esophageal cancer Neg Hx      Review of Systems  Constitutional:  Negative for chills and fever.  Respiratory:  Negative for cough and shortness of breath.    Cardiovascular:  Negative for chest pain.  Gastrointestinal:  Negative for nausea and vomiting.  Musculoskeletal:  Positive for arthralgias.     Objective:  Physical Exam Well nourished and well developed. General: Alert and oriented x3, cooperative and pleasant, no acute distress.  Musculoskeletal: Right hip exam: Painful and limited hip flexion internal rotation with pelvic tilting at 5 to 10 degrees, X rotation over 20 degrees No significant lower extremity edema, erythema or calf tenderness Left hip exam: Passive range of motion reveals some tightness without pain consistent with that or as compared to the right  Vital signs in last 24 hours:    Labs:   Estimated body mass index is 31.14 kg/m as calculated from the following:   Height as of 05/31/24: 5' 10 (1.778 m).   Weight as of 05/31/24: 98.4 kg.   Imaging Review Plain radiographs demonstrate severe degenerative joint disease of the right hip(s). The bone quality appears to be adequate for age and reported activity level.      Assessment/Plan:  End stage arthritis, right hip(s)  The patient history, physical examination, clinical judgement of the provider and imaging studies are consistent with end stage degenerative joint disease of the right hip(s) and total hip arthroplasty is deemed medically necessary. The treatment options including medical management, injection therapy, arthroscopy and arthroplasty were discussed at length. The risks and benefits of total hip arthroplasty were presented and reviewed. The risks due to aseptic loosening, infection, stiffness, dislocation/subluxation,  thromboembolic complications and other imponderables were discussed.  The patient acknowledged the explanation, agreed to proceed with the plan and consent was signed. Patient is being admitted for inpatient treatment for surgery, pain control, PT, OT, prophylactic antibiotics, VTE prophylaxis, progressive ambulation and ADL's and  discharge planning.The patient is planning to be discharged home.   [name], PA-C Orthopedic Surgery [facility] [place] [phone]

## 2024-06-08 ENCOUNTER — Ambulatory Visit (HOSPITAL_COMMUNITY): Payer: Self-pay | Admitting: Medical

## 2024-06-08 ENCOUNTER — Ambulatory Visit (HOSPITAL_COMMUNITY)

## 2024-06-08 ENCOUNTER — Encounter (HOSPITAL_COMMUNITY): Payer: Self-pay | Admitting: Orthopedic Surgery

## 2024-06-08 ENCOUNTER — Encounter (HOSPITAL_COMMUNITY)
Admission: RE | Disposition: A | Discharge status: Home / Self Care | Payer: Self-pay | Source: Home / Self Care | Attending: Orthopedic Surgery

## 2024-06-08 ENCOUNTER — Ambulatory Visit (HOSPITAL_BASED_OUTPATIENT_CLINIC_OR_DEPARTMENT_OTHER): Payer: Self-pay

## 2024-06-08 ENCOUNTER — Other Ambulatory Visit: Payer: Self-pay

## 2024-06-08 ENCOUNTER — Ambulatory Visit (HOSPITAL_COMMUNITY)
Admission: RE | Admit: 2024-06-08 | Discharge: 2024-06-08 | Disposition: A | Discharge status: Home / Self Care | Attending: Orthopedic Surgery | Admitting: Orthopedic Surgery

## 2024-06-08 DIAGNOSIS — Z96641 Presence of right artificial hip joint: Secondary | ICD-10-CM

## 2024-06-08 DIAGNOSIS — Z79890 Hormone replacement therapy (postmenopausal): Secondary | ICD-10-CM | POA: Diagnosis not present

## 2024-06-08 DIAGNOSIS — Z87891 Personal history of nicotine dependence: Secondary | ICD-10-CM | POA: Insufficient documentation

## 2024-06-08 DIAGNOSIS — E039 Hypothyroidism, unspecified: Secondary | ICD-10-CM | POA: Insufficient documentation

## 2024-06-08 DIAGNOSIS — I4510 Unspecified right bundle-branch block: Secondary | ICD-10-CM | POA: Diagnosis not present

## 2024-06-08 DIAGNOSIS — M1611 Unilateral primary osteoarthritis, right hip: Secondary | ICD-10-CM | POA: Insufficient documentation

## 2024-06-08 DIAGNOSIS — E119 Type 2 diabetes mellitus without complications: Secondary | ICD-10-CM

## 2024-06-08 HISTORY — PX: TOTAL HIP ARTHROPLASTY: SHX124

## 2024-06-08 LAB — TYPE AND SCREEN
ABO/RH(D): A POS
Antibody Screen: NEGATIVE

## 2024-06-08 LAB — GLUCOSE, CAPILLARY: Glucose-Capillary: 138 mg/dL — ABNORMAL HIGH (ref 70–99)

## 2024-06-08 LAB — ABO/RH: ABO/RH(D): A POS

## 2024-06-08 SURGERY — ARTHROPLASTY, HIP, TOTAL, ANTERIOR APPROACH
Anesthesia: Spinal | Site: Hip | Laterality: Right

## 2024-06-08 MED ORDER — PROPOFOL 500 MG/50ML IV EMUL
INTRAVENOUS | Status: DC | PRN
  Administered 2024-06-08: 20 mg via INTRAVENOUS
  Administered 2024-06-08: 55 ug/kg/min via INTRAVENOUS
  Administered 2024-06-08 (×2): 20 mg via INTRAVENOUS
  Administered 2024-06-08: 55 ug/kg/min via INTRAVENOUS
  Administered 2024-06-08: 20 mg via INTRAVENOUS

## 2024-06-08 MED ORDER — ALBUMIN HUMAN 5 % IV SOLN: INTRAVENOUS | Status: DC | PRN

## 2024-06-08 MED ORDER — STERILE WATER FOR IRRIGATION IR SOLN
Status: DC | PRN
  Administered 2024-06-08 (×2): 2000 mL

## 2024-06-08 MED ORDER — LACTATED RINGERS IV BOLUS
500.0000 mL | Freq: Once | INTRAVENOUS | Status: AC
  Administered 2024-06-08 (×2): 500 mL via INTRAVENOUS

## 2024-06-08 MED ORDER — FENTANYL CITRATE (PF) 100 MCG/2ML IJ SOLN
INTRAMUSCULAR | Status: DC | PRN
  Administered 2024-06-08 (×2): 50 ug via INTRAVENOUS

## 2024-06-08 MED ORDER — ALBUMIN HUMAN 5 % IV SOLN
INTRAVENOUS | Status: AC
  Filled 2024-06-08: qty 250

## 2024-06-08 MED ORDER — DEXAMETHASONE SODIUM PHOSPHATE 10 MG/ML IJ SOLN
8.0000 mg | Freq: Once | INTRAMUSCULAR | Status: AC
  Administered 2024-06-08 (×2): 8 mg via INTRAVENOUS

## 2024-06-08 MED ORDER — FENTANYL CITRATE (PF) 100 MCG/2ML IJ SOLN
INTRAMUSCULAR | Status: AC
  Filled 2024-06-08: qty 2

## 2024-06-08 MED ORDER — PHENYLEPHRINE HCL-NACL 20-0.9 MG/250ML-% IV SOLN
INTRAVENOUS | Status: DC | PRN
  Administered 2024-06-08 (×2): 25 ug/min via INTRAVENOUS

## 2024-06-08 MED ORDER — 0.9 % SODIUM CHLORIDE (POUR BTL) OPTIME
TOPICAL | Status: DC | PRN
  Administered 2024-06-08 (×2): 1000 mL

## 2024-06-08 MED ORDER — LACTATED RINGERS IV SOLN: INTRAVENOUS | Status: DC

## 2024-06-08 MED ORDER — PROPOFOL 1000 MG/100ML IV EMUL
INTRAVENOUS | Status: AC
Start: 2024-06-08 — End: 2024-06-08
  Filled 2024-06-08: qty 100

## 2024-06-08 MED ORDER — AMISULPRIDE (ANTIEMETIC) 5 MG/2ML IV SOLN: 10.0000 mg | Freq: Once | INTRAVENOUS | Status: DC | PRN

## 2024-06-08 MED ORDER — TRANEXAMIC ACID-NACL 1000-0.7 MG/100ML-% IV SOLN
1000.0000 mg | Freq: Once | INTRAVENOUS | Status: AC
  Administered 2024-06-08 (×2): 1000 mg via INTRAVENOUS

## 2024-06-08 MED ORDER — BUPIVACAINE IN DEXTROSE 0.75-8.25 % IT SOLN
INTRATHECAL | Status: DC | PRN
  Administered 2024-06-08 (×2): 1.8 mL via INTRATHECAL

## 2024-06-08 MED ORDER — SODIUM CHLORIDE (PF) 0.9 % IJ SOLN
INTRAMUSCULAR | Status: DC | PRN
  Administered 2024-06-08 (×2): 61 mL

## 2024-06-08 MED ORDER — ONDANSETRON HCL 4 MG/2ML IJ SOLN
INTRAMUSCULAR | Status: DC | PRN
  Administered 2024-06-08 (×2): 4 mg via INTRAVENOUS

## 2024-06-08 MED ORDER — ORAL CARE MOUTH RINSE: 15.0000 mL | Freq: Once | OROMUCOSAL | Status: AC

## 2024-06-08 MED ORDER — KETOROLAC TROMETHAMINE 30 MG/ML IJ SOLN
INTRAMUSCULAR | Status: AC
  Filled 2024-06-08: qty 1

## 2024-06-08 MED ORDER — CHLORHEXIDINE GLUCONATE 0.12 % MT SOLN
15.0000 mL | Freq: Once | OROMUCOSAL | Status: AC
  Administered 2024-06-08 (×2): 15 mL via OROMUCOSAL

## 2024-06-08 MED ORDER — SODIUM CHLORIDE (PF) 0.9 % IJ SOLN
INTRAMUSCULAR | Status: AC
  Filled 2024-06-08: qty 30

## 2024-06-08 MED ORDER — POVIDONE-IODINE 10 % EX SWAB
2.0000 | Freq: Once | CUTANEOUS | Status: AC
  Administered 2024-06-08 (×2): 2 via TOPICAL

## 2024-06-08 MED ORDER — ONDANSETRON HCL 4 MG/2ML IJ SOLN
INTRAMUSCULAR | Status: AC
  Filled 2024-06-08: qty 2

## 2024-06-08 MED ORDER — CEFAZOLIN SODIUM-DEXTROSE 2-4 GM/100ML-% IV SOLN: 2.0000 g | Freq: Four times a day (QID) | INTRAVENOUS | Status: DC

## 2024-06-08 MED ORDER — ACETAMINOPHEN 10 MG/ML IV SOLN
INTRAVENOUS | Status: DC | PRN
  Administered 2024-06-08 (×2): 1000 mg via INTRAVENOUS

## 2024-06-08 MED ORDER — DEXAMETHASONE SODIUM PHOSPHATE 10 MG/ML IJ SOLN
INTRAMUSCULAR | Status: AC
  Filled 2024-06-08: qty 1

## 2024-06-08 MED ORDER — FENTANYL CITRATE PF 50 MCG/ML IJ SOSY: 25.0000 ug | PREFILLED_SYRINGE | INTRAMUSCULAR | Status: DC | PRN

## 2024-06-08 MED ORDER — ACETAMINOPHEN 10 MG/ML IV SOLN
INTRAVENOUS | Status: AC
  Filled 2024-06-08: qty 100

## 2024-06-08 MED ORDER — BUPIVACAINE-EPINEPHRINE (PF) 0.25% -1:200000 IJ SOLN
INTRAMUSCULAR | Status: AC
  Filled 2024-06-08: qty 30

## 2024-06-08 MED ORDER — CEFAZOLIN SODIUM-DEXTROSE 2-4 GM/100ML-% IV SOLN
2.0000 g | INTRAVENOUS | Status: AC
  Administered 2024-06-08 (×2): 2 g via INTRAVENOUS
  Filled 2024-06-08: qty 100

## 2024-06-08 MED ORDER — TRANEXAMIC ACID-NACL 1000-0.7 MG/100ML-% IV SOLN
INTRAVENOUS | Status: AC
  Filled 2024-06-08: qty 100

## 2024-06-08 MED ORDER — TRANEXAMIC ACID-NACL 1000-0.7 MG/100ML-% IV SOLN
1000.0000 mg | INTRAVENOUS | Status: AC
  Administered 2024-06-08 (×2): 1000 mg via INTRAVENOUS
  Filled 2024-06-08: qty 100

## 2024-06-08 SURGICAL SUPPLY — 38 items
BAG COUNTER SPONGE SURGICOUNT (BAG) IMPLANT
BAG ZIPLOCK 12X15 (MISCELLANEOUS) ×1 IMPLANT
BALL HIP ARTICU EZE 36 8.5 (Hips) IMPLANT
BLADE SAG 18X100X1.27 (BLADE) ×1 IMPLANT
COVER PERINEAL POST (MISCELLANEOUS) ×1 IMPLANT
COVER SURGICAL LIGHT HANDLE (MISCELLANEOUS) ×1 IMPLANT
CUP ACET PINNACLE SECTR 56MM (Hips) IMPLANT
DERMABOND ADVANCED .7 DNX12 (GAUZE/BANDAGES/DRESSINGS) ×1 IMPLANT
DRAPE FOOT SWITCH (DRAPES) ×1 IMPLANT
DRAPE STERI IOBAN 125X83 (DRAPES) ×1 IMPLANT
DRAPE U-SHAPE 47X51 STRL (DRAPES) ×2 IMPLANT
DRESSING AQUACEL AG SP 3.5X10 (GAUZE/BANDAGES/DRESSINGS) ×1 IMPLANT
DRSG AQUACEL AG ADV 3.5X10 (GAUZE/BANDAGES/DRESSINGS) IMPLANT
DURAPREP 26ML APPLICATOR (WOUND CARE) ×1 IMPLANT
ELECT REM PT RETURN 15FT ADLT (MISCELLANEOUS) ×1 IMPLANT
GLOVE BIO SURGEON STRL SZ 6 (GLOVE) ×1 IMPLANT
GLOVE BIOGEL PI IND STRL 6.5 (GLOVE) ×1 IMPLANT
GLOVE BIOGEL PI IND STRL 7.5 (GLOVE) ×1 IMPLANT
GLOVE ORTHO TXT STRL SZ7.5 (GLOVE) ×2 IMPLANT
GOWN STRL REUS W/ TWL LRG LVL3 (GOWN DISPOSABLE) ×2 IMPLANT
HOLDER FOLEY CATH W/STRAP (MISCELLANEOUS) ×1 IMPLANT
KIT TURNOVER KIT A (KITS) ×1 IMPLANT
MANIFOLD NEPTUNE II (INSTRUMENTS) ×1 IMPLANT
NDL SAFETY ECLIPSE 18X1.5 (NEEDLE) ×1 IMPLANT
PACK ANTERIOR HIP CUSTOM (KITS) ×1 IMPLANT
PENCIL SMOKE EVACUATOR (MISCELLANEOUS) ×1 IMPLANT
PINNACLE ALTRX PLUS 4 N 36X56 (Hips) IMPLANT
SCREW PINN CAN 6.5X20 (Screw) IMPLANT
STEM FEM ACTIS HIGH SZ7 (Stem) IMPLANT
SUT MNCRL AB 4-0 PS2 18 (SUTURE) ×1 IMPLANT
SUT VIC AB 1 CT1 36 (SUTURE) ×3 IMPLANT
SUT VIC AB 2-0 CT1 TAPERPNT 27 (SUTURE) ×2 IMPLANT
SUTURE STRATFX 0 PDS 27 VIOLET (SUTURE) ×1 IMPLANT
SYR 3ML LL SCALE MARK (SYRINGE) ×1 IMPLANT
TOWEL GREEN STERILE FF (TOWEL DISPOSABLE) ×1 IMPLANT
TRAY FOLEY MTR SLVR 16FR STAT (SET/KITS/TRAYS/PACK) ×1 IMPLANT
TUBE SUCTION HIGH CAP CLEAR NV (SUCTIONS) ×1 IMPLANT
WATER STERILE IRR 1000ML POUR (IV SOLUTION) ×1 IMPLANT

## 2024-06-08 NOTE — Discharge Instructions (Signed)

## 2024-06-08 NOTE — Op Note (Signed)
 NAME:  Jeffrey [name]                ACCOUNT NO.: [id number]      MEDICAL RECORD NO.: [id number]      FACILITY:  [facility]      PHYSICIAN:  [name]  DATE OF BIRTH:  09/12/1941     DATE OF PROCEDURE:  06/08/2024                                 OPERATIVE REPORT         PREOPERATIVE DIAGNOSIS: Right  hip osteoarthritis.      POSTOPERATIVE DIAGNOSIS:  Right hip osteoarthritis.      PROCEDURE:  Right total hip replacement through an anterior approach   utilizing DePuy THR system, component size 56 mm pinnacle cup, a size 36+4 neutral   Altrex liner, a size 7 Hi Actis stem with a 36+8.5 Articuleze metal head ball      SURGEON:  Donnice JONETTA. Car, M.D.      ASSISTANT:  [name], PA-C     ANESTHESIA:  Spinal.      SPECIMENS:  None.      COMPLICATIONS:  None.      BLOOD LOSS:  250 cc     DRAINS:  None.      INDICATION OF THE PROCEDURE:  Jeffrey [name] is a 83 y.o. male who had   presented to office for evaluation of right hip pain.  Radiographs revealed   progressive degenerative changes with bone-on-bone   articulation of the  hip joint, including subchondral cystic changes and osteophytes.  The patient had painful limited range of   motion significantly affecting their overall quality of life and function.  The patient was failing to    respond to conservative measures including medications and/or injections and activity modification and at this point was ready   to proceed with more definitive measures.  Consent was obtained for   benefit of pain relief.  Specific risks of infection, DVT, component   failure, dislocation, neurovascular injury, and need for revision surgery were reviewed in the office.     PROCEDURE IN DETAIL:  The patient was brought to operative theater.   Once adequate anesthesia, preoperative antibiotics, 2 gm of Ancef , 1 gm of Tranexamic Acid , and 10 mg of Decadron  were administered, the patient was positioned supine on the [employer] table.  Once the patient was safely positioned with adequate padding of boney prominences we predraped out the hip, and used fluoroscopy to confirm orientation of the pelvis.      The right hip was then prepped and draped from proximal iliac crest to   mid thigh with a shower curtain technique.      Time-out was performed identifying the patient, planned procedure, and the appropriate extremity.     An incision was then made 2 cm lateral to the   anterior superior iliac spine extending over the orientation of the   tensor fascia lata muscle and sharp dissection was carried down to the   fascia of the muscle.      The fascia was then incised.  The muscle belly was identified and swept   laterally and retractor placed along the superior neck.  Following   cauterization of the circumflex vessels and removing some pericapsular   fat, a second cobra retractor was placed on the inferior neck.  A T-capsulotomy was made along the line of the   superior neck to the trochanteric fossa, then extended proximally and   distally.  Tag sutures were placed and the retractors were then placed   intracapsular.  We then identified the trochanteric fossa and   orientation of my neck cut and then made a neck osteotomy with the femur on traction.  The femoral   head was removed without difficulty or complication.  Traction was let   off and retractors were placed posterior and anterior around the   acetabulum.      The labrum and foveal tissue were debrided.  I began reaming with a 51 mm   reamer and reamed up to 55 mm reamer with good bony bed preparation and a 56 mm  cup was chosen.  The final 56 mm Pinnacle cup was then impacted under fluoroscopy to confirm the depth of penetration and orientation with respect to   Abduction and forward flexion.  A screw was placed into the ilium followed by the hole eliminator.  The final   36+4 neutral Altrex liner was impacted with good visualized rim fit.  The cup  was positioned anatomically within the acetabular portion of the pelvis.      At this point, the femur was rolled to 100 degrees.  Further capsule was   released off the inferior aspect of the femoral neck.  I then   released the superior capsule proximally.  With the leg in a neutral position the hook was placed laterally   along the femur under the vastus lateralis origin and elevated manually and then held in position using the hook attachment on the bed.  The leg was then extended and adducted with the leg rolled to 100   degrees of external rotation.  Retractors were placed along the medial calcar and posteriorly over the greater trochanter.  Once the proximal femur was fully   exposed, I used a box osteotome to set orientation.  I then began   broaching with the starting chili pepper broach and passed this by hand and then broached up to 7.  With the 7 broach in place I chose a high offset neck and did several trial reductions.  The offset was appropriate, leg lengths   appeared to be equal best matched with the +8.5 head ball trial confirmed radiographically.   Given these findings, I went ahead and dislocated the hip, repositioned all   retractors and positioned the right hip in the extended and abducted position.  The final 7 Hi Actis stem was   chosen and it was impacted down to the level of neck cut.  Based on this   and the trial reductions, a final 36+8.5 Articuleze metal head ball was chosen and   impacted onto a clean and dry trunnion, and the hip was reduced.  The   hip had been irrigated throughout the case again at this point.  I did   reapproximate the superior capsular leaflet to the anterior leaflet   using #1 Vicryl.  The fascia of the   tensor fascia lata muscle was then reapproximated using #1 Vicryl and #0 Stratafix sutures.  The   remaining wound was closed with 2-0 Vicryl and running 4-0 Monocryl.   The hip was cleaned, dried, and dressed sterilely using Dermabond and    Aquacel dressing.  The patient was then brought   to recovery room in stable condition tolerating the procedure well.    [name],  PA-C was present for the entirety of the case involved from   preoperative positioning, perioperative retractor management, general   facilitation of the case, as well as primary wound closure as assistant.            Donnice CORDOBA Ernie, M.D.        06/08/2024 8:36 AM

## 2024-06-08 NOTE — Evaluation (Signed)
 Physical Therapy Evaluation Patient Details Name: Jeffrey [name] MRN: [id number] DOB: 05-06-1941 Today's Date: 06/08/2024  History of Present Illness  83 yo male presents to therapy s/p R THA, anterior approach on 06/08/2024 due to failure of conservative measures. Pt PMH includes but is not limited to: arthritis, DM II, HOH, HLD, hypothyroidism, R BBB, rosacea, and R RTC repair.  Clinical Impression    Jeffrey [name] is a 83 y.o. male POD 0 s/p R THA. Patient reports occasional use of SPC with mobility at baseline. Patient is now limited by functional impairments (see PT problem list below) and requires CGA and cues for transfers and gait with RW. Patient was able to ambulate 65 feet with RW and CGA and progressing to close S and cues for safe walker management. Patient educated on safe sequencing for functional mobility tasks, fall risk prevention, use of RW, pain management and goal, use of CP/ice pt  and family  verbalized understanding of safe guarding position for people assisting with mobility. Patient instructed in exercises to facilitate ROM and circulation reviewed and HO provided and pt to have HEP only and ed to increase number of reps or x per day as tolerated. Patient will benefit from continued skilled PT interventions to address impairments and progress towards PLOF. Patient has met mobility goals at adequate level for discharge home with family support; will continue to follow if pt continues acute stay to progress towards Mod I goals.       If plan is discharge home, recommend the following: A little help with walking and/or transfers;A little help with bathing/dressing/bathroom;Assistance with cooking/housework;Assist for transportation;Help with stairs or ramp for entrance   Can travel by private vehicle        Equipment Recommendations Rolling walker (2 wheels)  Recommendations for Other Services       Functional Status Assessment Patient has had a recent decline in their  functional status and demonstrates the ability to make significant improvements in function in a reasonable and predictable amount of time.     Precautions / Restrictions Precautions Precautions: Fall Restrictions Weight Bearing Restrictions Per Provider Order: No      Mobility  Bed Mobility Overal bed mobility: Needs Assistance Bed Mobility: Supine to Sit     Supine to sit: Supervision     General bed mobility comments: min cues    Transfers Overall transfer level: Needs assistance Equipment used: Rolling walker (2 wheels) Transfers: Sit to/from Stand Sit to Stand: Contact guard assist           General transfer comment: min cues for UE and AD placement    Ambulation/Gait Ambulation/Gait assistance: Contact guard assist, Supervision Gait Distance (Feet): 65 Feet Assistive device: Rolling walker (2 wheels) Gait Pattern/deviations: Step-to pattern, Decreased stance time - right, Antalgic, Trunk flexed Gait velocity: decreased     General Gait Details: slight trunk flexion with B UE support at RW to offload R LE in stance phase, step almost through pattern and min cues  Stairs            Wheelchair Mobility     Tilt Bed    Modified Rankin (Stroke Patients Only)       Balance Overall balance assessment: Needs assistance Sitting-balance support: Feet supported Sitting balance-Leahy Scale: Good     Standing balance support: Bilateral upper extremity supported, During functional activity, Reliant on assistive device for balance Standing balance-Leahy Scale: Fair Standing balance comment: static standing with no UE support  Pertinent Vitals/Pain Pain Assessment Pain Assessment: 0-10 Pain Score: 0-No pain Pain Location: R hip and LE Pain Intervention(s): Limited activity within patient's tolerance, Monitored during session, Premedicated before session, Repositioned, Ice applied    Home Living  Family/patient expects to be discharged to:: Private residence Living Arrangements: Spouse/significant other Available Help at Discharge: Family Type of Home: House (townhome) Home Access: Level entry       Home Layout: One level Home Equipment: Surveyor, minerals - single point;Grab bars - tub/shower      Prior Function Prior Level of Function : Independent/Modified Independent;Driving             Mobility Comments: intermittent use of SPC for mobility tasks, IND for all ADLs, self care tasks       Extremity/Trunk Assessment        Lower Extremity Assessment Lower Extremity Assessment: RLE deficits/detail RLE Deficits / Details: ankle DF/PF 5/5 RLE Sensation: WNL    Cervical / Trunk Assessment Cervical / Trunk Assessment: Normal  Communication   Communication Communication: Impaired Factors Affecting Communication: Hearing impaired    Cognition Arousal: Alert Behavior During Therapy: WFL for tasks assessed/performed   PT - Cognitive impairments: No apparent impairments                         Following commands: Intact       Cueing       General Comments      Exercises Total Joint Exercises Ankle Circles/Pumps: AROM, Both, 10 reps Quad Sets: AROM, Right, 5 reps Heel Slides: AROM, Right, 5 reps Hip ABduction/ADduction: AROM, Right, 5 reps, Standing Long Arc Quad: AROM, Right, 5 reps, Standing Knee Flexion: AROM, Right, 5 reps, Standing Standing Hip Extension: AROM, Right, 5 reps, Standing   Assessment/Plan    PT Assessment Patient needs continued PT services  PT Problem List Decreased strength;Decreased activity tolerance;Decreased range of motion;Decreased balance;Decreased mobility;Decreased coordination;Pain       PT Treatment Interventions DME instruction;Gait training;Functional mobility training;Therapeutic activities;Therapeutic exercise;Balance training;Neuromuscular re-education;Patient/family education;Modalities    PT  Goals (Current goals can be found in the Care Plan section)  Acute Rehab PT Goals Patient Stated Goal: 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 PT Goal Formulation: With patient Time For Goal Achievement: 06/22/24 Potential to Achieve Goals: Good    Frequency 7X/week     Co-evaluation               AM-PAC PT 6 Clicks Mobility  Outcome Measure Help needed turning from your back to your side while in a flat bed without using bedrails?: None Help needed moving from lying on your back to sitting on the side of a flat bed without using bedrails?: A Little Help needed moving to and from a bed to a chair (including a wheelchair)?: A Little Help needed standing up from a chair using your arms (e.g., wheelchair or bedside chair)?: A Little Help needed to walk in hospital room?: A Little Help needed climbing 3-5 steps with a railing? : A Lot 6 Click Score: 18    End of Session Equipment Utilized During Treatment: Gait belt Activity Tolerance: Patient tolerated treatment well;No increased pain Patient left: in chair;with call bell/phone within reach;with family/visitor present Nurse Communication: Mobility status;Other (comment) (pt readiness for d/c from PT standpoint) PT Visit Diagnosis: Unsteadiness on feet (R26.81);Other abnormalities of gait and mobility (R26.89);Muscle weakness (generalized) (M62.81);Difficulty in walking, not elsewhere classified (R26.2);Pain;History of falling (Z91.81) Pain - Right/Left: Right  Pain - part of body: Knee;Hip    Time: 8697-8665 PT Time Calculation (min) (ACUTE ONLY): 32 min   Charges:   PT Evaluation $PT Eval Low Complexity: 1 Low PT Treatments $Gait Training: 8-22 mins PT General Charges $$ ACUTE PT VISIT: 1 Visit         Jeffrey, PT Acute Rehab   Jeffrey [name] 06/08/2024, 1:45 PM

## 2024-06-08 NOTE — Anesthesia Procedure Notes (Signed)
 Procedure Name: MAC Date/Time: 06/08/2024 8:28 AM  Performed by: Landy Chip HERO, CRNAPre-anesthesia Checklist: Patient identified, Emergency Drugs available, Suction available, Patient being monitored and Timeout performed Patient Re-evaluated:Patient Re-evaluated prior to induction Oxygen Delivery Method: Simple face mask Preoxygenation: Pre-oxygenation with 100% oxygen Induction Type: IV induction Placement Confirmation: positive ETCO2 Dental Injury: Teeth and Oropharynx as per pre-operative assessment

## 2024-06-08 NOTE — Anesthesia Preprocedure Evaluation (Addendum)
 Anesthesia Evaluation  Patient identified by MRN, date of birth, ID band Patient awake    Reviewed: Allergy & Precautions, NPO status , Patient's Chart, lab work & pertinent test results  Airway Mallampati: III  TM Distance: >3 FB Neck ROM: Full    Dental  (+) Dental Advisory Given   Pulmonary former smoker   breath sounds clear to auscultation       Cardiovascular + dysrhythmias (RBBB) + Valvular Problems/Murmurs (Normal EF. Mild/Mod AI) AI  Rhythm:Regular Rate:Normal     Neuro/Psych negative neurological ROS     GI/Hepatic negative GI ROS, Neg liver ROS,,,  Endo/Other  Hypothyroidism    Renal/GU negative Renal ROS     Musculoskeletal  (+) Arthritis ,    Abdominal   Peds  Hematology negative hematology ROS (+) 05/11/24 labs  Hgb 14 HCT 43 Plts 259    Anesthesia Other Findings   Reproductive/Obstetrics                              Anesthesia Physical Anesthesia Plan  ASA: 2  Anesthesia Plan: Spinal   Post-op Pain Management: Ofirmev  IV (intra-op)*   Induction:   PONV Risk Score and Plan: 1 and Propofol  infusion, Dexamethasone , Ondansetron  and Treatment may vary due to age or medical condition  Airway Management Planned: Natural Airway and Simple Face Mask  Additional Equipment:   Intra-op Plan:   Post-operative Plan:   Informed Consent: I have reviewed the patients History and Physical, chart, labs and discussed the procedure including the risks, benefits and alternatives for the proposed anesthesia with the patient or authorized representative who has indicated his/her understanding and acceptance.       Plan Discussed with: CRNA  Anesthesia Plan Comments:          Anesthesia Quick Evaluation

## 2024-06-08 NOTE — Progress Notes (Signed)
 Patient has eaten is waiting on rehab exercises and instructions. Still needs to void before he can go home. Nursing discharge instructions have been completed. Must pass rehab portion before discharge home.

## 2024-06-08 NOTE — Interval H&P Note (Signed)
 History and Physical Interval Note:  06/08/2024 8:16 AM  Jeffrey [name]  has presented today for surgery, with the diagnosis of Right hip osteoarthritis.  The various methods of treatment have been discussed with the patient and family. After consideration of risks, benefits and other options for treatment, the patient has consented to  Procedure(s): ARTHROPLASTY, HIP, TOTAL, ANTERIOR APPROACH (Right) as a surgical intervention.  The patient's history has been reviewed, patient examined, no change in status, stable for surgery.  I have reviewed the patient's chart and labs.  Questions were answered to the patient's satisfaction.     [name]

## 2024-06-08 NOTE — Anesthesia Postprocedure Evaluation (Signed)
 Anesthesia Post Note  Patient: LARENZ FRASIER  Procedure(s) Performed: ARTHROPLASTY, HIP, TOTAL, ANTERIOR APPROACH (Right: Hip)     Patient location during evaluation: PACU Anesthesia Type: Spinal Level of consciousness: awake and alert Pain management: pain level controlled Vital Signs Assessment: post-procedure vital signs reviewed and stable Respiratory status: spontaneous breathing and respiratory function stable Cardiovascular status: blood pressure returned to baseline and stable Postop Assessment: spinal receding Anesthetic complications: no   No notable events documented.  Last Vitals:  Vitals:   06/08/24 1300 06/08/24 1330  BP: (!) 158/66 (!) 156/66  Pulse: 88 82  Resp: 20 18  Temp: 36.7 C   SpO2: 99% 99%    Last Pain:  Vitals:   06/08/24 1330  TempSrc:   PainSc: 0-No pain                 Epifanio Lamar BRAVO

## 2024-06-08 NOTE — Transfer of Care (Signed)
 Immediate Anesthesia Transfer of Care Note  Patient: Jeffrey [name]  Procedure(s) Performed: ARTHROPLASTY, HIP, TOTAL, ANTERIOR APPROACH (Right: Hip)  Patient Location: PACU  Anesthesia Type:Spinal  Level of Consciousness: drowsy  Airway & Oxygen Therapy: Patient Spontanous Breathing and Patient connected to face mask oxygen  Post-op Assessment: Report given to RN and Post -op Vital signs reviewed and stable  Post vital signs: Reviewed and stable  Last Vitals:  Vitals Value Taken Time  BP    Temp    Pulse    Resp    SpO2      Last Pain:  Vitals:   06/08/24 0719  TempSrc:   PainSc: 0-No pain         Complications: No notable events documented.

## 2024-06-08 NOTE — Anesthesia Procedure Notes (Signed)
 Spinal  Patient location during procedure: OR Start time: 06/08/2024 8:31 AM End time: 06/08/2024 8:36 AM Reason for block: surgical anesthesia Staffing Performed: anesthesiologist  Anesthesiologist: Epifanio Charleston, MD Performed by: Epifanio Charleston, MD Authorized by: Epifanio Charleston, MD   Preanesthetic Checklist Completed: patient identified, IV checked, site marked, risks and benefits discussed, surgical consent, monitors and equipment checked, pre-op evaluation and timeout performed Spinal Block Patient position: sitting Prep: DuraPrep Patient monitoring: heart rate, cardiac monitor, continuous pulse ox and blood pressure Approach: midline Location: L4-5 Injection technique: single-shot Needle Needle type: Pencan  Needle gauge: 24 G Needle length: 9 cm Assessment Sensory level: T4 Events: CSF return

## 2024-06-09 ENCOUNTER — Encounter (HOSPITAL_COMMUNITY): Payer: Self-pay | Admitting: Orthopedic Surgery
